# Patient Record
Sex: Male | Born: 1965 | Race: White | Hispanic: No | Smoking: Former smoker
Health system: Southern US, Community
[De-identification: ages and names within clinical notes are randomized; demographics above are authoritative.]

---

## 2003-08-25 ENCOUNTER — Ambulatory Visit (HOSPITAL_COMMUNITY): Admission: RE | Admit: 2003-08-25 | Discharge: 2003-08-25 | Payer: Self-pay | Admitting: Internal Medicine

## 2005-09-15 ENCOUNTER — Emergency Department (HOSPITAL_COMMUNITY): Admission: EM | Admit: 2005-09-15 | Discharge: 2005-09-15 | Payer: Self-pay | Admitting: Emergency Medicine

## 2014-01-31 ENCOUNTER — Ambulatory Visit
Admission: RE | Admit: 2014-01-31 | Discharge: 2014-01-31 | Disposition: A | Discharge status: Home / Self Care | Payer: 59 | Source: Ambulatory Visit | Attending: Medical | Admitting: Medical

## 2014-01-31 ENCOUNTER — Ambulatory Visit (INDEPENDENT_AMBULATORY_CARE_PROVIDER_SITE_OTHER): Payer: 59 | Admitting: Medical

## 2014-01-31 ENCOUNTER — Encounter: Payer: Self-pay | Admitting: Medical

## 2014-01-31 VITALS — BP 130/80 | HR 88 | Temp 98.2°F | Resp 14 | Ht 67.0 in | Wt 223.0 lb

## 2014-01-31 DIAGNOSIS — M7989 Other specified soft tissue disorders: Secondary | ICD-10-CM

## 2014-01-31 DIAGNOSIS — M2550 Pain in unspecified joint: Secondary | ICD-10-CM

## 2014-01-31 DIAGNOSIS — R209 Unspecified disturbances of skin sensation: Secondary | ICD-10-CM

## 2014-01-31 DIAGNOSIS — J0110 Acute frontal sinusitis, unspecified: Secondary | ICD-10-CM

## 2014-01-31 DIAGNOSIS — R200 Anesthesia of skin: Secondary | ICD-10-CM

## 2014-01-31 MED ORDER — AMOXICILLIN 500 MG PO TABS: ORAL_TABLET | ORAL | Status: AC

## 2014-01-31 MED ORDER — NAPROXEN 500 MG PO TABS: 500.0000 mg | ORAL_TABLET | Freq: Two times a day (BID) | ORAL | Status: AC

## 2014-01-31 NOTE — Patient Instructions (Signed)
Thank you for giving me the opportunity to serve you today.    Your diagnosis today includes: Encounter Diagnoses  Name Primary?  . Joint pain Yes  . Bilateral hand swelling   . Hand numbness   . Acute frontal sinusitis, recurrence not specified      Specific recommendations today include:  Regarding your hand symptoms, I suspect both osteoarthritis and carpal tunnel syndrome  Begin reinforced wrist splint both hands at nighttime only.  You can get this over-the-counter  Begin Naprosyn 500 mg twice daily for pain and inflammation of the hands  Go for x-rays of both hands  Using a Neti Pot or nasal saline flush daily  Begin Amoxicillin antiboitc for sinusitis  Continue Flonase nasal spray for now  We will call with xray results  Lets plan a followup visit in 2 weeks  Return pending studies.    I have included other useful information below for your review.  Sinusitis Sinuses are air pockets within the bones of your face. The growth of bacteria within a sinus leads to infection. Infection keeps the sinuses from draining. This infection is called sinusitis. SYMPTOMS There will be different areas of pain depending on which sinuses have become infected.  The maxillary sinuses often produce pain beneath the eyes.   Frontal sinusitis may cause pain in the middle of the forehead and above the eyes.  Other problems (symptoms) include:  Toothaches.   Colored, pus-like (purulent) drainage from the nose.   Any swelling, warmth, or tenderness over the sinus areas may be signs of infection.  TREATMENT Sinusitis is most often determined by an exam and you may have x-rays taken. If x-rays have been taken, make sure you obtain your results. Or find out how you are to obtain them. Your caregiver may give you medications (antibiotics). These are medications that will help kill the infection. You may also be given a medication (decongestant) that helps to reduce sinus swelling.    HOME CARE INSTRUCTIONS  Only take over-the-counter or prescription medicines for pain, discomfort, or fever as directed by your caregiver.   Drink extra fluids. Fluids help thin the mucus so your sinuses can drain more easily.   Applying either moist heat or ice packs to the sinus areas may help relieve discomfort.   Use saline nasal sprays to help moisten your sinuses. The sprays can be found at your local drugstore.  SEEK IMMEDIATE MEDICAL CARE IF YOU DEVELOP:  High fever that is still present after two days of antibiotic treatment.   Increasing pain, severe headaches, or toothache.   Nausea, vomiting, or drowsiness.   Unusual swelling around the face or trouble seeing.  MAKE SURE YOU:   Understand these instructions.   Will watch your condition.   Will get help right away if you are not doing well or get worse.  Document Released: 07/28/2005 Document Re-Released: 07/10/2008 [facility] Patient Information 2011 [place], [place].   Carpal Tunnel Release Carpal tunnel release is done to relieve the pressure on the nerves and tendons on the bottom side of your wrist.  LET YOUR CAREGIVER KNOW ABOUT:   Allergies to food or medicine.  Medicines taken, including vitamins, herbs, eyedrops, over-the-counter medicines, and creams.  Use of steroids (by mouth or creams).  Previous problems with anesthetics or numbing medicines.  History of bleeding problems or blood clots.  Previous surgery.  Other health problems, including diabetes and kidney problems.  Possibility of pregnancy, if this applies. RISKS AND COMPLICATIONS  Some problems  that may happen after this procedure include:  Infection.  Damage to the nerves, arteries or tendons could occur. This would be very uncommon.  Bleeding. BEFORE THE PROCEDURE   This surgery may be done while you are asleep (general anesthetic) or may be done under a block where only your forearm and the surgical area is numb.  If the  surgery is done under a block, the numbness will gradually wear off within several hours after surgery. HOME CARE INSTRUCTIONS   Have a responsible person with you for 24 hours.  Do not drive a car or use public transportation for 24 hours.  Only take over-the-counter or prescription medicines for pain, discomfort, or fever as directed by your caregiver. Take them as directed.  You may put ice on the palm side of the affected wrist.  Put ice in a plastic bag.  Place a towel between your skin and the bag.  Leave the ice on for 20 to 30 minutes, 4 times per day.  If you were given a splint to keep your wrist from bending, use it as directed. It is important to wear the splint at night or as directed. Use the splint for as Greenman as you have pain or numbness in your hand, arm, or wrist. This may take 1 to 2 months.  Keep your hand raised (elevated) above the level of your heart as much as possible. This keeps swelling down and helps with discomfort.  Change bandages (dressings) as directed.  Keep the wound clean and dry. SEEK MEDICAL CARE IF:   You develop pain not relieved with medications.  You develop numbness of your hand.  You develop bleeding from your surgical site.  You have an oral temperature above 102 F (38.9 C).  You develop redness or swelling of the surgical site.  You develop new, unexplained problems. SEEK IMMEDIATE MEDICAL CARE IF:   You develop a rash.  You have difficulty breathing.  You develop any reaction or side effects to medications given. Document Released: 10/18/2003 Document Revised: 10/20/2011 Document Reviewed: 06/03/2007 [facility] Patient Information 2015 [place], [place]. This information is not intended to replace advice given to you by your health care Ica Daye. Make sure you discuss any questions you have with your health care Joella Saefong.   Osteoarthritis Osteoarthritis is a disease that causes soreness and swelling (inflammation) of a  joint. It occurs when the cartilage at the affected joint wears down. Cartilage acts as a cushion, covering the ends of bones where they meet to form a joint. Osteoarthritis is the most common form of arthritis. It often occurs in older people. The joints affected most often by this condition include those in the:  Ends of the fingers.  Thumbs.  Neck.  Lower back.  Knees.  Hips. CAUSES  Over time, the cartilage that covers the ends of bones begins to wear away. This causes bone to rub on bone, producing pain and stiffness in the affected joints.  RISK FACTORS Certain factors can increase your chances of having osteoarthritis, including:  Older age.  Excessive body weight.  Overuse of joints. SIGNS AND SYMPTOMS   Pain, swelling, and stiffness in the joint.  Over time, the joint may lose its normal shape.  Small deposits of bone (osteophytes) may grow on the edges of the joint.  Bits of bone or cartilage can break off and float inside the joint space. This may cause more pain and damage. DIAGNOSIS  Your health care Tran Arzuaga will do a physical exam  and ask about your symptoms. Various tests may be ordered, such as:  X-rays of the affected joint.  An MRI scan.  Blood tests to rule out other types of arthritis.  Joint fluid tests. This involves using a needle to draw fluid from the joint and examining the fluid under a microscope. TREATMENT  Goals of treatment are to control pain and improve joint function. Treatment plans may include:  A prescribed exercise program that allows for rest and joint relief.  A weight control plan.  Pain relief techniques, such as:  Properly applied heat and cold.  Electric pulses delivered to nerve endings under the skin (transcutaneous electrical nerve stimulation, TENS).  Massage.  Certain nutritional supplements.  Medicines to control pain, such as:  Acetaminophen.  Nonsteroidal anti-inflammatory drugs (NSAIDs), such as  naproxen.  Narcotic or central-acting agents, such as tramadol.  Corticosteroids. These can be given orally or as an injection.  Surgery to reposition the bones and relieve pain (osteotomy) or to remove loose pieces of bone and cartilage. Joint replacement may be needed in advanced states of osteoarthritis. HOME CARE INSTRUCTIONS   Only take over-the-counter or prescription medicines as directed by your health care Cleto Claggett. Take all medicines exactly as instructed.  Maintain a healthy weight. Follow your health care Ameliah Baskins's instructions for weight control. This may include dietary instructions.  Exercise as directed. Your health care Jaidan Prevette can recommend specific types of exercise. These may include:  Strengthening exercises--These are done to strengthen the muscles that support joints affected by arthritis. They can be performed with weights or with exercise bands to add resistance.  Aerobic activities--These are exercises, such as brisk walking or low-impact aerobics, that get your heart pumping.  Range-of-motion activities--These keep your joints limber.  Balance and agility exercises--These help you maintain daily living skills.  Rest your affected joints as directed by your health care Lathen Seal.  Follow up with your health care Naz Denunzio as directed. SEEK MEDICAL CARE IF:   Your skin turns red.  You develop a rash in addition to your joint pain.  You have worsening joint pain. SEEK IMMEDIATE MEDICAL CARE IF:  You have a significant loss of weight or appetite.  You have a fever along with joint or muscle aches.  You have night sweats. FOR MORE INFORMATION  National Institute of Arthritis and Musculoskeletal and Skin Diseases: www.niams.http://www.myers.net/nih.gov [employer] Aging: https://walker.com/www.nia.nih.gov American College of Rheumatology: www.rheumatology.org Document Released: 07/28/2005 Document Revised: 05/18/2013 Document Reviewed: 04/04/2013 [facility] Patient Information  2015 [place], [place]. This information is not intended to replace advice given to you by your health care Lavonya Hoerner. Make sure you discuss any questions you have with your health care Radonna Bracher.

## 2014-01-31 NOTE — Progress Notes (Addendum)
Subjective:  Jonathan Dougherty is a 48 y.o. male who presents as a new patient today.  No recent primary care, no recent medical care.  He notes ongoing pain and swelling in his hands for the last year or more.  Worse in the right hand over the second and third knuckles, decreased range of motion of his fingers, the second finger and third finger sometimes locked at the DIP.  Left hand is achy but not as bad as his symptoms on the right hand .  He also has had numbness in both hands from the DIP distally for the last few year. He works as a Control and instrumentation engineerpress operator, does work with Psychologist, prison and probation servicesvibrating machinery, uses his hands at work daily.  No prior evaluation or treatment for this using over-the-counter.  Using [employer] gloves and also using [place] Arthritis cream. Gets shoulder pain but no other joint pain in general.  He does have some back issues that flare up from time to time.    He also notes Cafiero history of sinus problems times the last 2 years.  Has been treated various times for sinusitis.  Currently for the last several weeks having sinus pressure, upper teeth pain, sinus pain, headache, has runny nose and sneezing in the morning begin to the rest the day goes dry in the nose, can't seem to blow out mucous sense of smell and taste is down. Denies fever, nausea vomiting, no ear pain, no cough  Patient is a non-smoker.  Using nasal saline and Flonase for symptoms.  Denies sick contacts.  No other aggravating or relieving factors.    Between the hand pain and numbness in sinus pain not getting much sleep   No other c/o.  ROS as in subjective   Objective: Filed Vitals:   01/31/14 1412  BP: 130/80  Pulse: 88  Temp: 98.2 F (36.8 C)  Resp: 14    General appearance: Alert, WD/WN, no distress, pleasant white male                             Skin: warm, no rash                           Head: +maxillary sinus tenderness,                            Eyes: conjunctiva normal, corneas clear, PERRLA                      Ears: flatTMs, external ear canals normal                          Nose: septum midline, turbinates swollen, with erythema and clear discharge             Mouth/throat: MMM, tongue normal, mild pharyngeal erythema, thick mucous drainage down back of throat                           Neck: supple, no adenopathy, no thyromegaly, nontender                          Heart: RRR, normal S1, S2, no murmurs  Lungs: CTA bilaterally, no wheezes, rales, or rhonchi  MSK:  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  . Joint pain Yes  . Bilateral hand swelling   . Hand numbness   . Acute frontal sinusitis, recurrence not specified    Discussed his his exam findings and symptoms suggestive of osteoarthritis and likely carpal tunnel syndrome.  We will send for x-rays of hands.  Begin amoxicillin for sinusitis, and discussed chronic allergy medication  Specific recommendations today include:  Regarding your hand symptoms, I suspect both osteoarthritis and carpal tunnel syndrome  Begin reinforced wrist splint both hands at nighttime only.  You can get this over-the-counter  Begin Naprosyn 500 mg twice daily for pain and inflammation of the hands  Go for x-rays of both hands  Using a Neti Pot or nasal saline flush daily  Begin Amoxicillin  antibiotic for sinusitis  Continue Flonase nasal spray for now  We will call with xray results  Lets plan a followup visit in 2 weeks  If symptoms are worse or not improving the next few days, then call or return

## 2014-02-24 ENCOUNTER — Ambulatory Visit: Payer: 59 | Admitting: Medical

## 2015-12-31 IMAGING — CR DG HAND COMPLETE 3+V*L*
3 series · 3 of 3 positions shown · non-contrast
Comparison: None.

CLINICAL DATA: 47-year-old male with hand swelling and pain.
Painful finger flexion. Initial encounter. Suspect osteoarthritis.

EXAM:
LEFT HAND - COMPLETE 3+ VIEW

[x hand pa left]
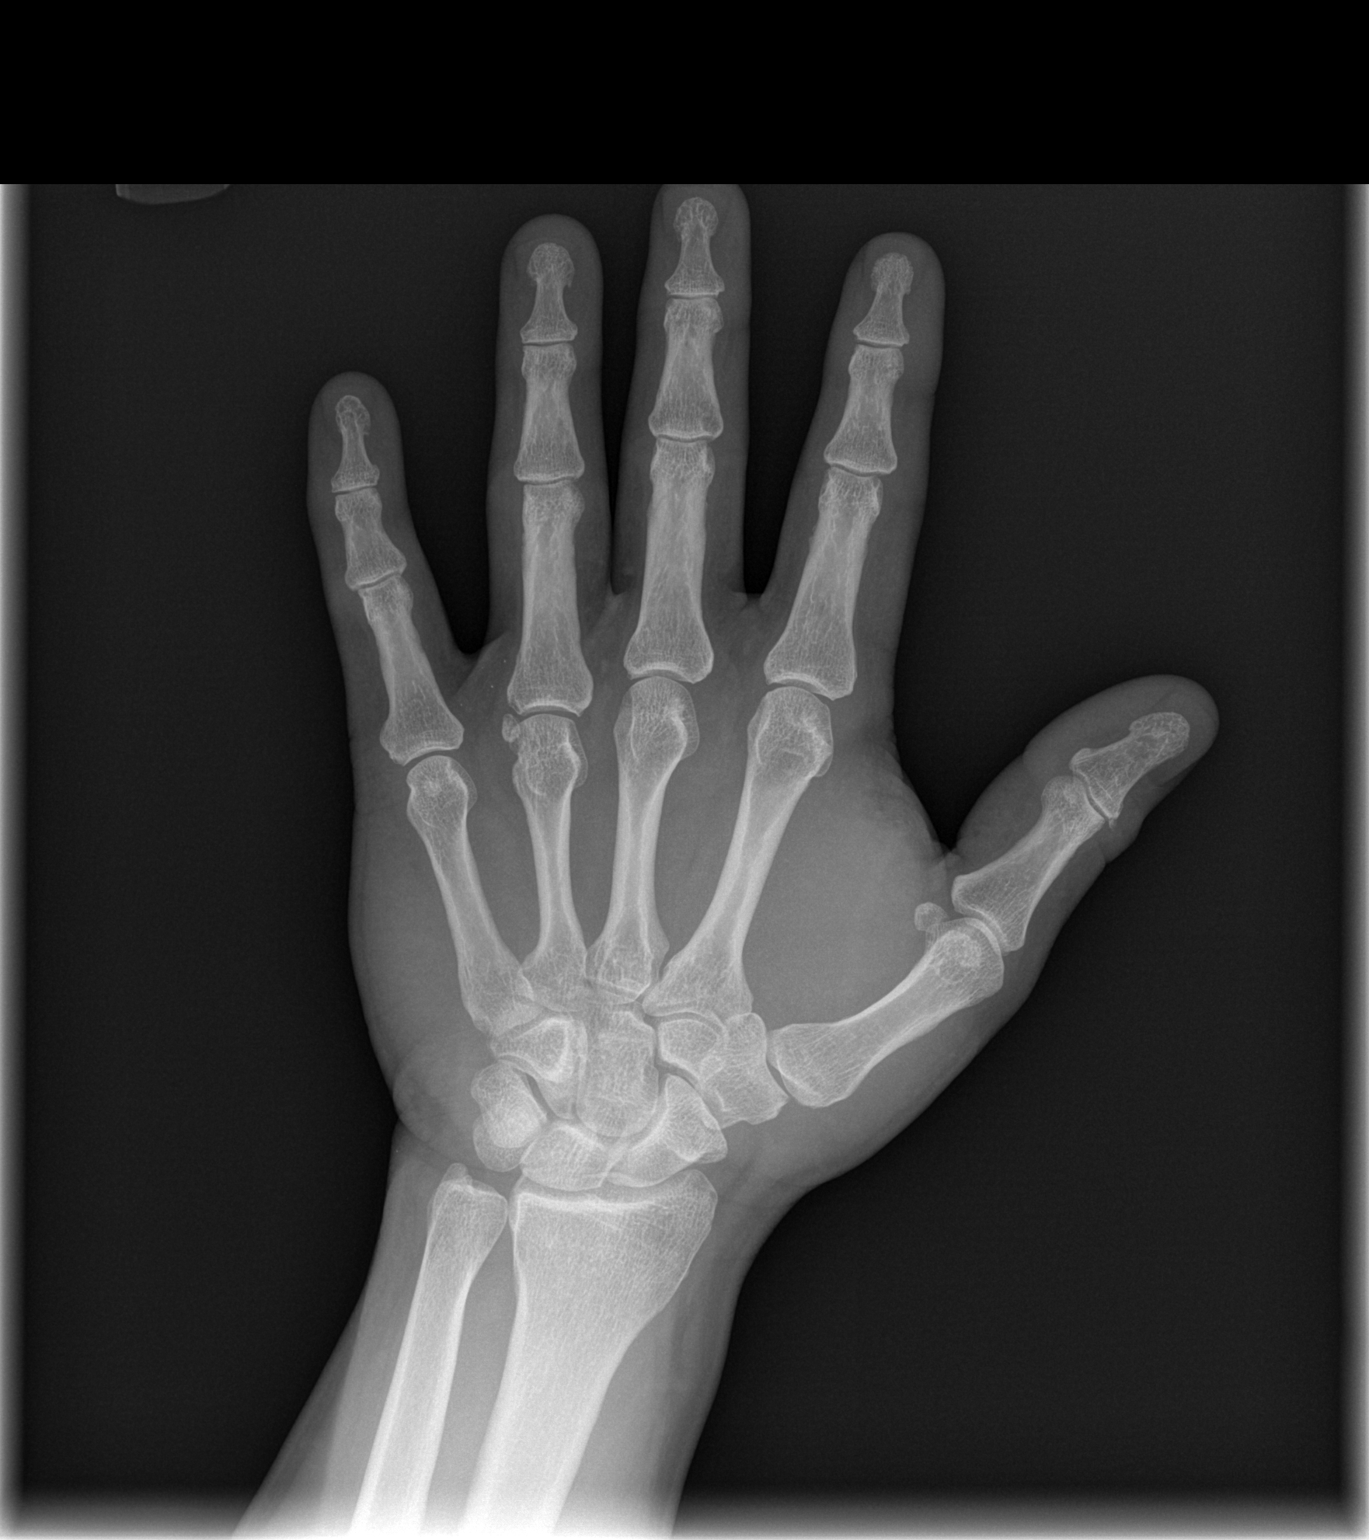

[x hand oblique left]
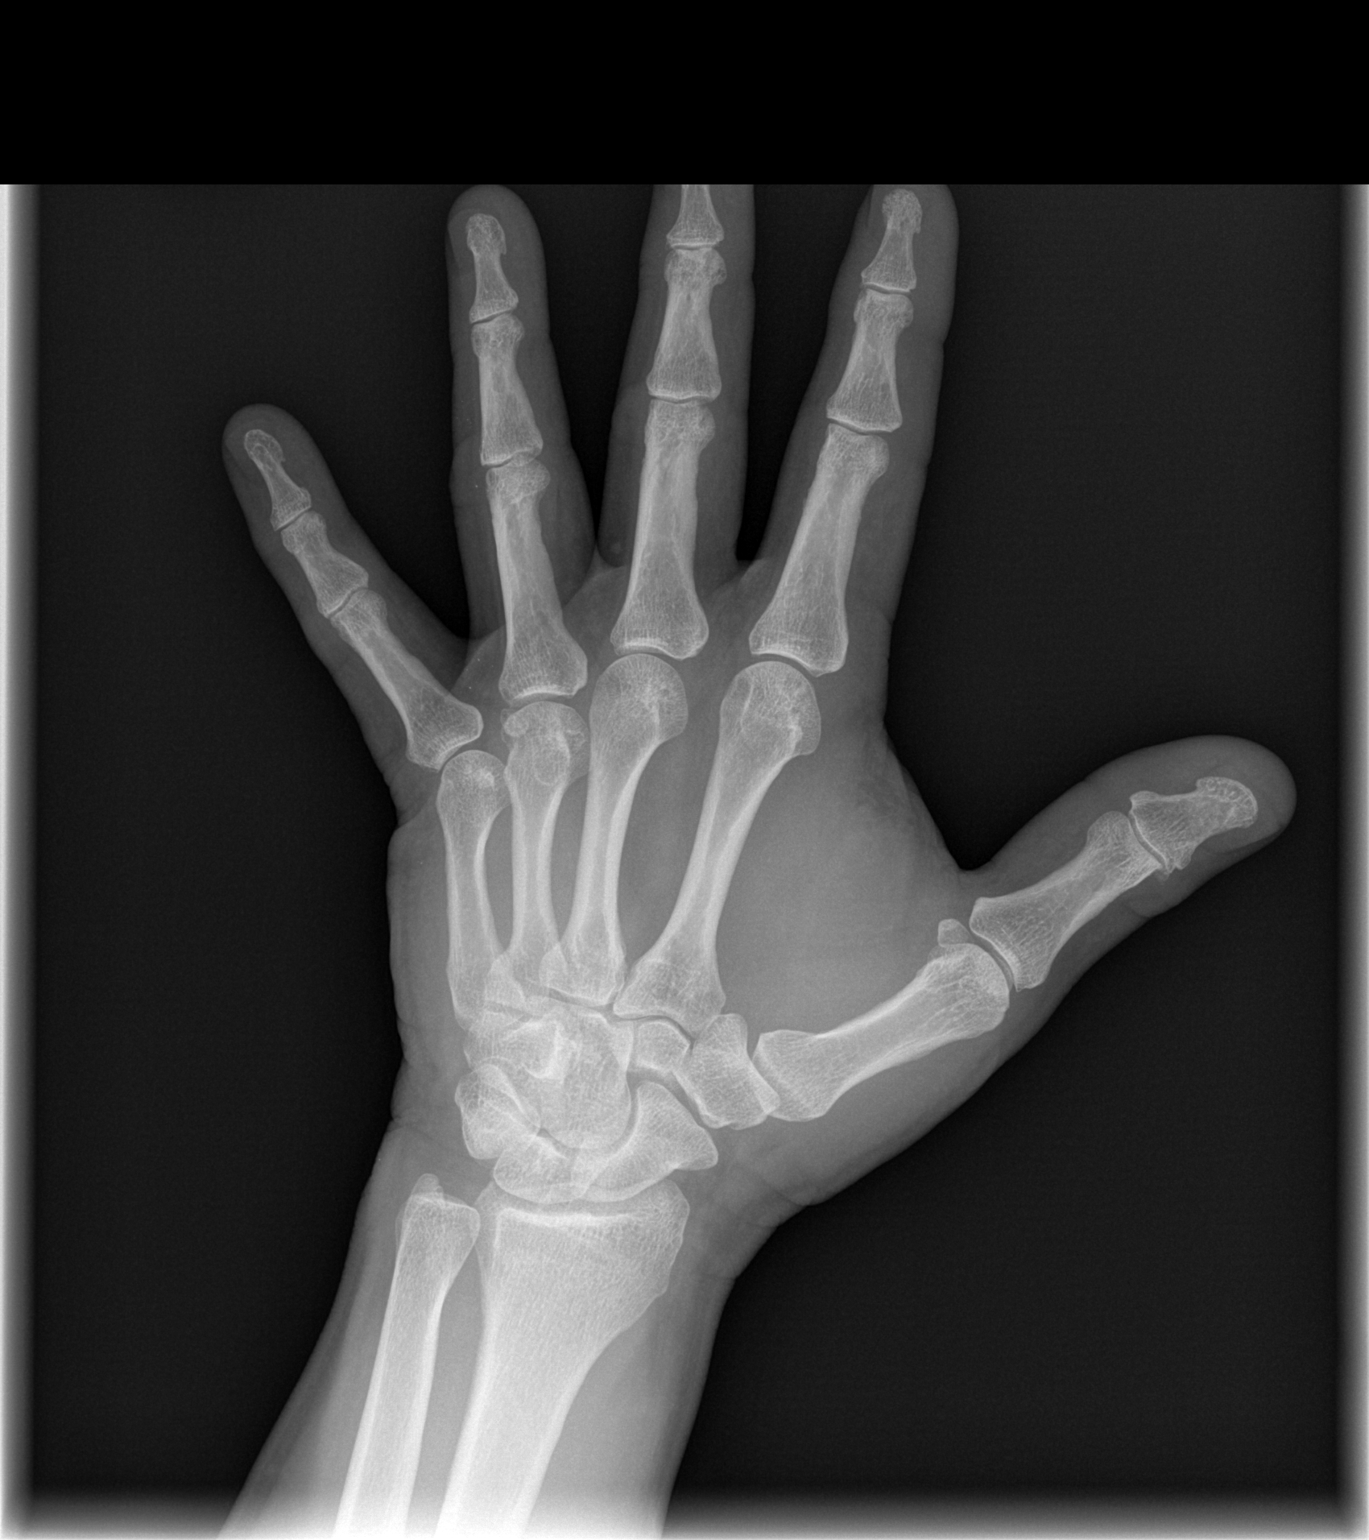

[x hand lat left]
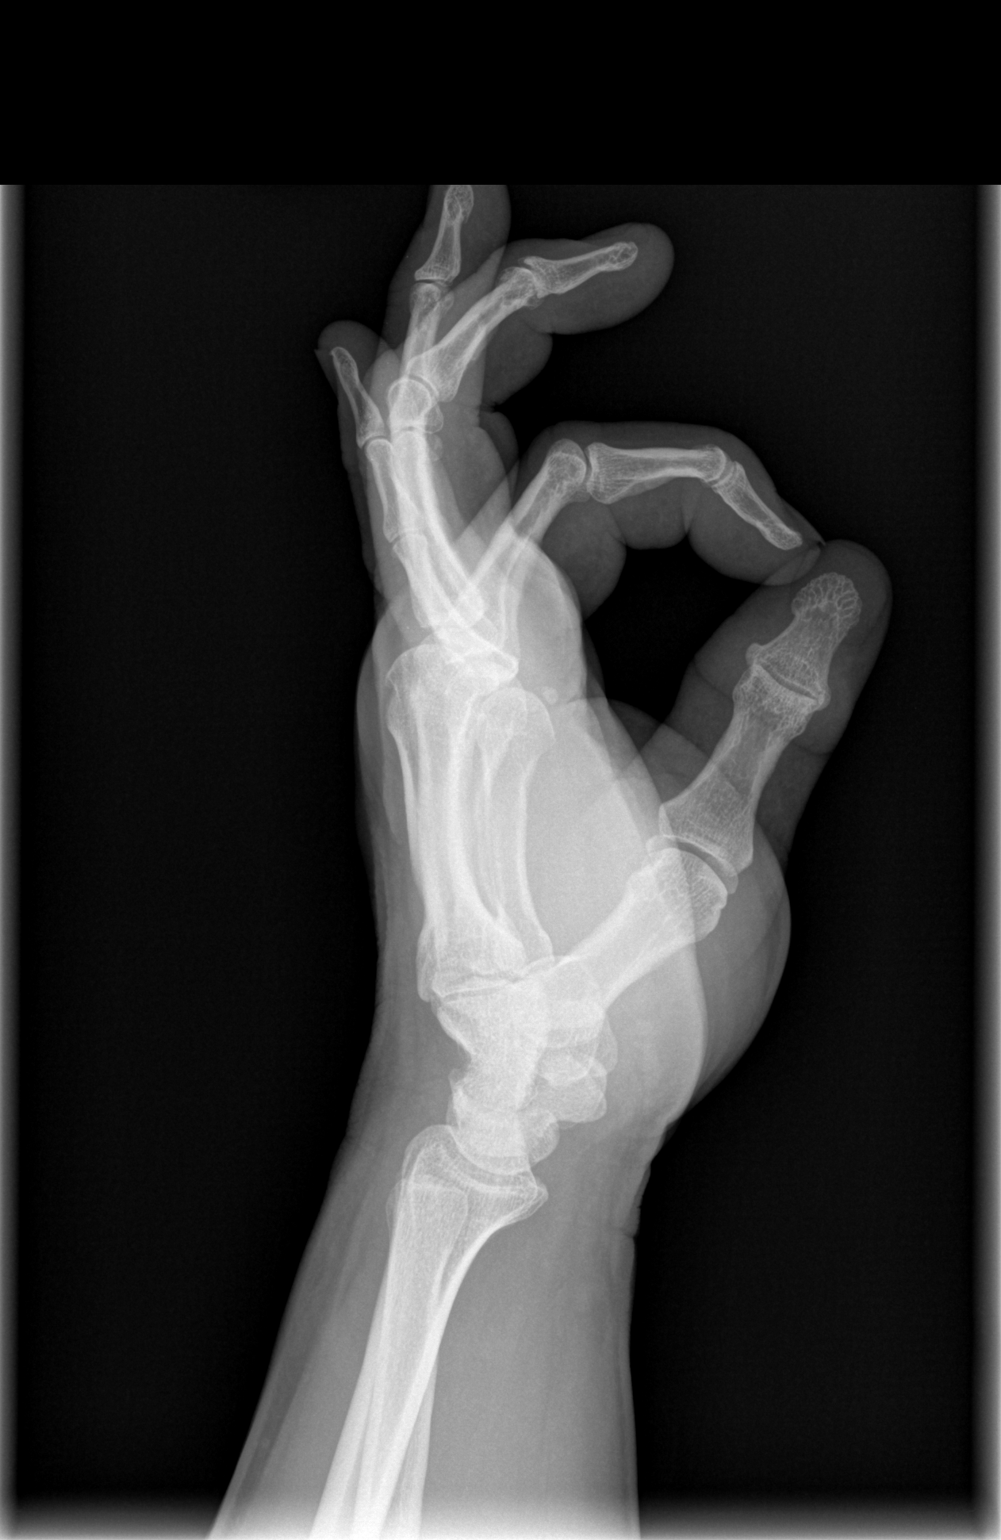

[3 of 3 positions shown; findings below may reference images not displayed]

FINDINGS: Bone mineralization is within normal limits. Distal radius and ulna
are normal. Carpal bone alignment and joint spaces preserved. Small
chronic posttraumatic or excess Re ossicle along the ulnar aspect of
the left fourth MCP joint. Otherwise normal MCP joints.
Interphalangeal joints also appear preserved. There may be minimal
DIP osteophytosis. No acute fracture or dislocation.
IMPRESSION: 1. Minimal if any osteoarthritis affecting the interphalangeal
joints of the left hand.
2. Suspect sequelae of remote trauma at the left fourth MCP joint.

## 2015-12-31 IMAGING — CR DG HAND COMPLETE 3+V*R*
3 series · 3 of 3 positions shown · non-contrast
Comparison: Left hand series from the same day reported separately.

CLINICAL DATA: 47-year-old male with hand swelling and pain.
Painful finger flexion. Initial encounter. Suspect osteoarthritis.

EXAM:
RIGHT HAND - COMPLETE 3+ VIEW

[x hand pa right]
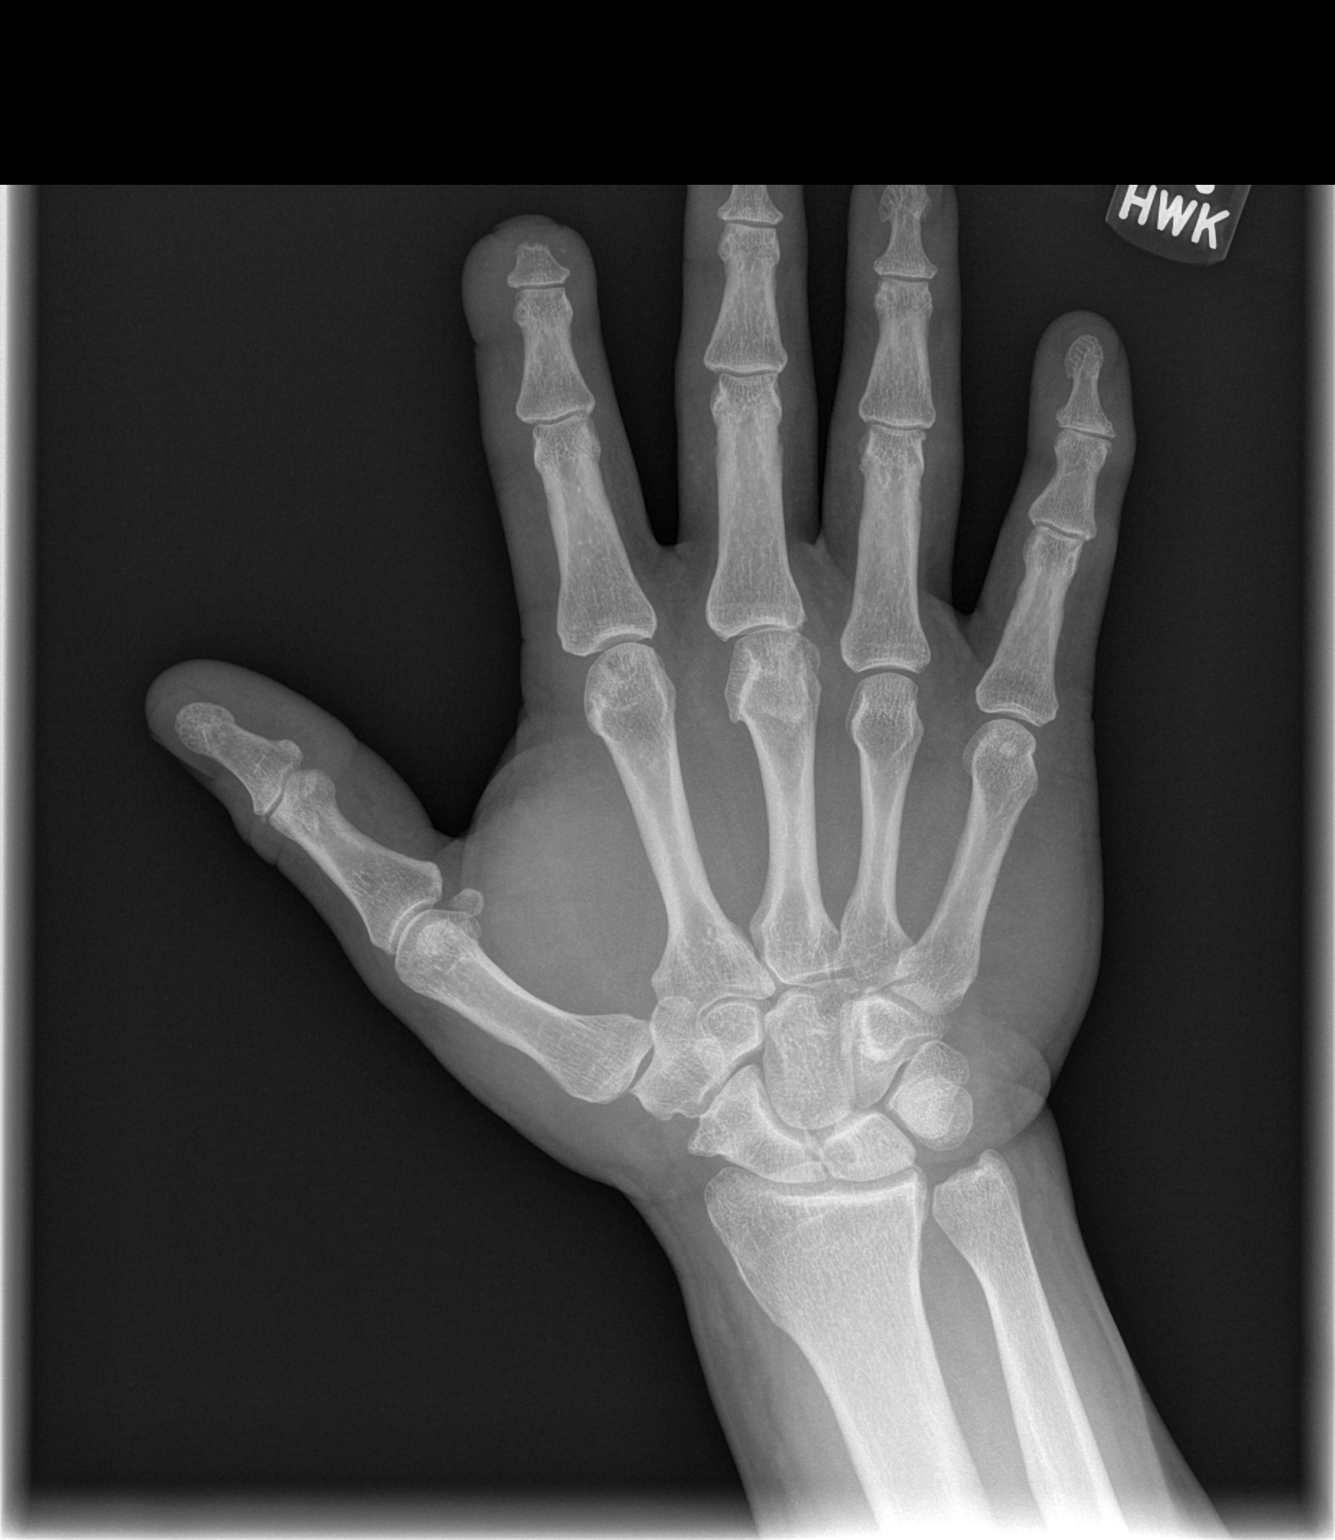

[x hand oblique right]
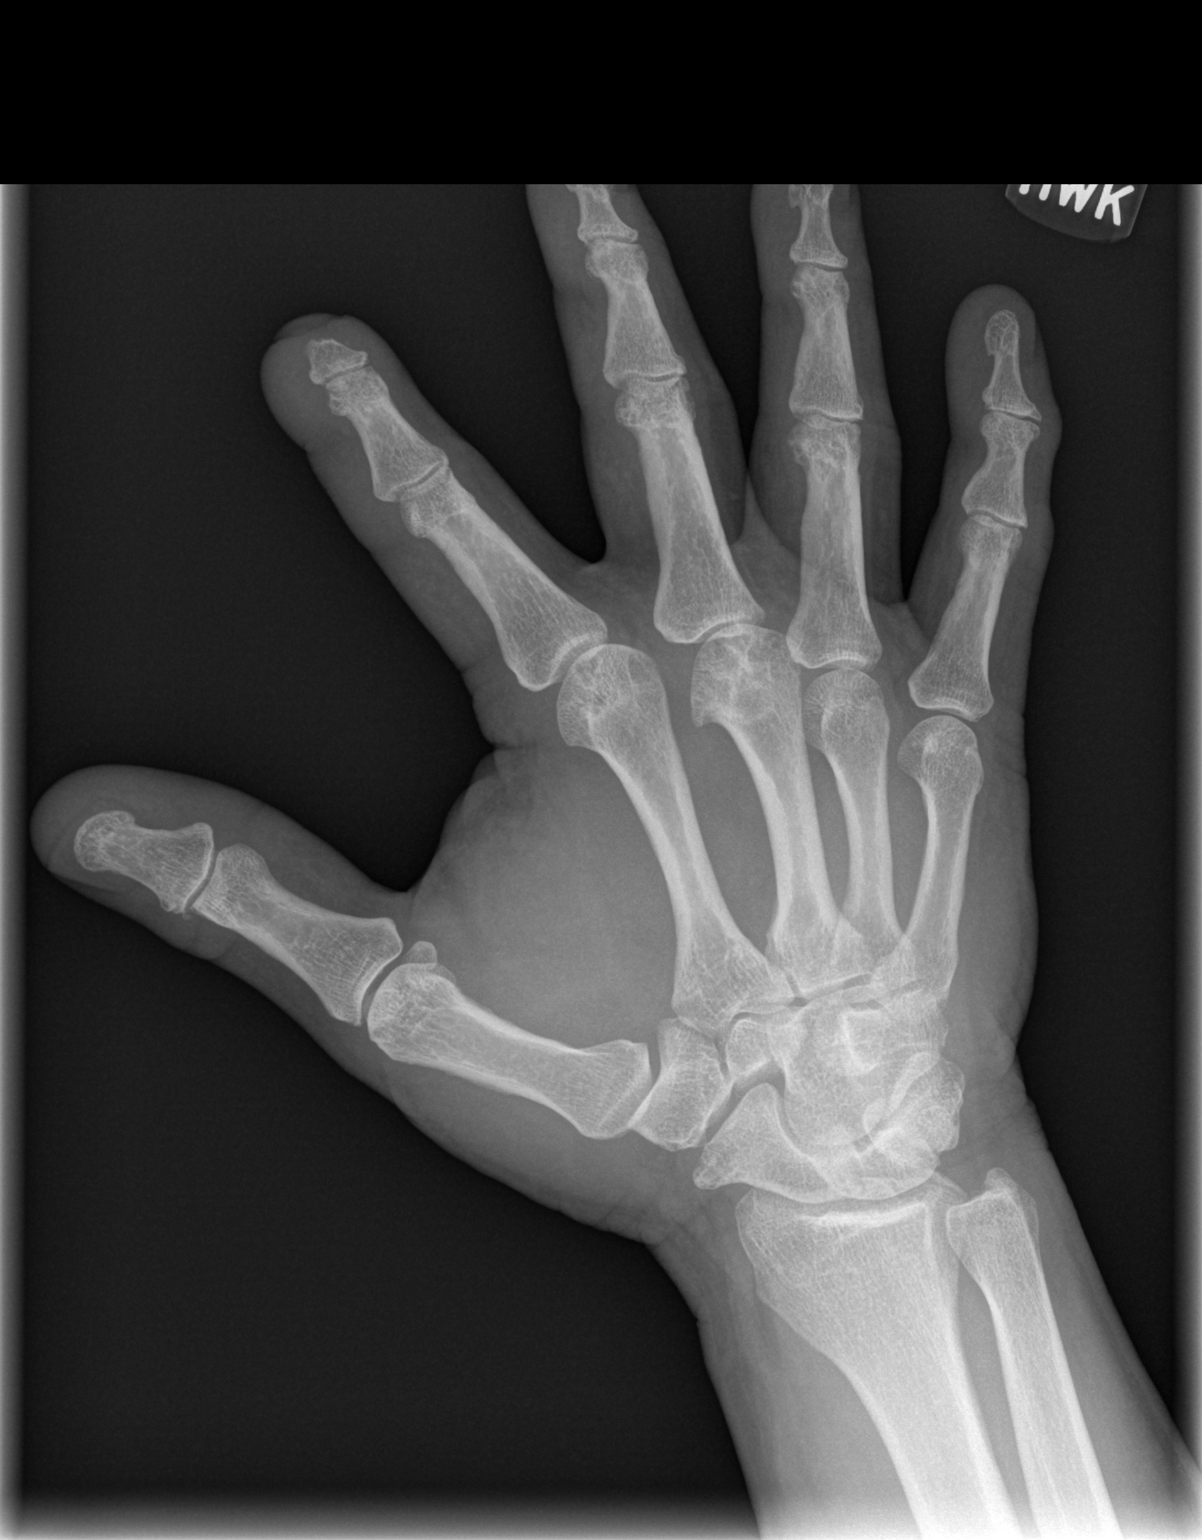

[x hand lat right]
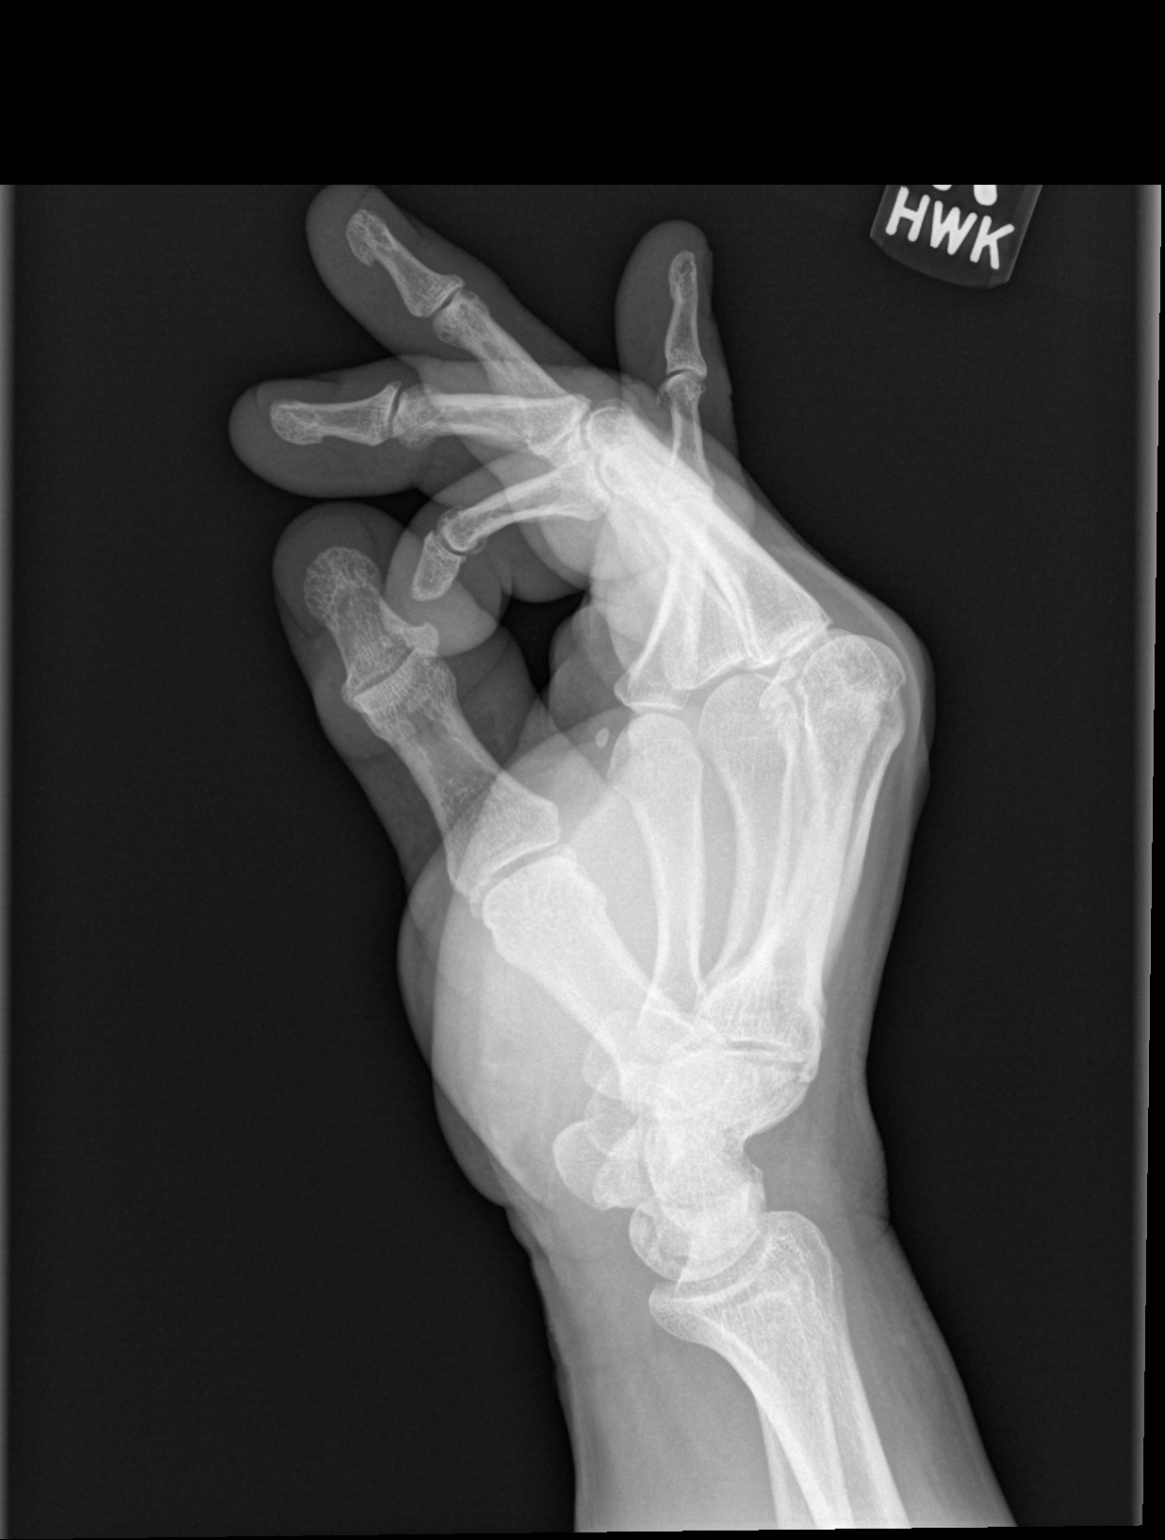

[3 of 3 positions shown; findings below may reference images not displayed]

FINDINGS: Bone mineralization is within normal limits. Truncation of the ulnar
styloid, favor posttraumatic rather than due to erosion. Cortical
irregularity at the distal scaphoid trapezial in articulation could
be posttraumatic or degenerative. Other carpal joint spaces are
preserved. Isolated third MCP joint space loss with mild subchondral
sclerosis and hook osteophytosis. Other MCP joints are preserved.
Chronically amputated distal aspect of the second distal phalanx.
Interphalangeal joints overall are preserved in the right hand.
There does appear to be mild DIP osteophytosis in the fifth finger.
No acute fracture or dislocation.
IMPRESSION: 1. Isolated right third MCP joint degeneration, most resembles
osteoarthritis. Mild right fifth DIP osteoarthritis.
2. Chronic posttraumatic changes suspected at the ulnar styloid, and
tuft of the right index finger. Chronic changes to the distal
scaphoid, favor posttraumatic rather than degenerative.

## 2022-06-11 ENCOUNTER — Other Ambulatory Visit: Payer: Self-pay | Admitting: Family Medicine

## 2022-06-11 ENCOUNTER — Other Ambulatory Visit (HOSPITAL_BASED_OUTPATIENT_CLINIC_OR_DEPARTMENT_OTHER): Payer: Self-pay | Admitting: Family Medicine

## 2022-06-11 DIAGNOSIS — E7800 Pure hypercholesterolemia, unspecified: Secondary | ICD-10-CM

## 2022-06-26 ENCOUNTER — Ambulatory Visit (HOSPITAL_BASED_OUTPATIENT_CLINIC_OR_DEPARTMENT_OTHER)
Admission: RE | Admit: 2022-06-26 | Discharge: 2022-06-26 | Disposition: A | Discharge status: Home / Self Care | Payer: Self-pay | Source: Ambulatory Visit | Attending: Family Medicine | Admitting: Family Medicine

## 2022-06-26 DIAGNOSIS — E7800 Pure hypercholesterolemia, unspecified: Secondary | ICD-10-CM | POA: Insufficient documentation

## 2024-01-12 ENCOUNTER — Other Ambulatory Visit: Payer: Self-pay | Admitting: Urology

## 2024-01-13 LAB — PSA: Prostate Specific Ag, Serum: 0.4 ng/mL (ref 0.0–4.0)
